# Patient Record
Sex: Female | Born: 1965 | Race: Black or African American | Hispanic: No | Marital: Married | Smoking: Current every day smoker
Health system: Southern US, Community
[De-identification: ages and names within clinical notes are randomized; demographics above are authoritative.]

## PROBLEM LIST (undated history)

## (undated) DIAGNOSIS — Z720 Tobacco use: Secondary | ICD-10-CM

---

## 2015-03-09 ENCOUNTER — Emergency Department (HOSPITAL_COMMUNITY): Payer: Federal, State, Local not specified - PPO

## 2015-03-09 ENCOUNTER — Encounter (HOSPITAL_COMMUNITY): Payer: Self-pay | Admitting: Emergency Medicine

## 2015-03-09 ENCOUNTER — Inpatient Hospital Stay (HOSPITAL_COMMUNITY)
Admission: EM | Admit: 2015-03-09 | Discharge: 2015-03-10 | DRG: 190 | Disposition: A | Discharge status: Home / Self Care | Payer: Federal, State, Local not specified - PPO | Attending: Internal Medicine | Admitting: Internal Medicine

## 2015-03-09 DIAGNOSIS — R0902 Hypoxemia: Secondary | ICD-10-CM

## 2015-03-09 DIAGNOSIS — R0602 Shortness of breath: Secondary | ICD-10-CM | POA: Diagnosis not present

## 2015-03-09 DIAGNOSIS — R079 Chest pain, unspecified: Secondary | ICD-10-CM

## 2015-03-09 DIAGNOSIS — J441 Chronic obstructive pulmonary disease with (acute) exacerbation: Principal | ICD-10-CM | POA: Insufficient documentation

## 2015-03-09 DIAGNOSIS — J440 Chronic obstructive pulmonary disease with acute lower respiratory infection: Secondary | ICD-10-CM | POA: Diagnosis present

## 2015-03-09 DIAGNOSIS — Z720 Tobacco use: Secondary | ICD-10-CM | POA: Diagnosis present

## 2015-03-09 DIAGNOSIS — R0600 Dyspnea, unspecified: Secondary | ICD-10-CM

## 2015-03-09 DIAGNOSIS — F1010 Alcohol abuse, uncomplicated: Secondary | ICD-10-CM | POA: Diagnosis present

## 2015-03-09 DIAGNOSIS — E876 Hypokalemia: Secondary | ICD-10-CM | POA: Diagnosis present

## 2015-03-09 DIAGNOSIS — F17210 Nicotine dependence, cigarettes, uncomplicated: Secondary | ICD-10-CM | POA: Diagnosis present

## 2015-03-09 DIAGNOSIS — J9601 Acute respiratory failure with hypoxia: Secondary | ICD-10-CM | POA: Diagnosis present

## 2015-03-09 DIAGNOSIS — J209 Acute bronchitis, unspecified: Secondary | ICD-10-CM | POA: Diagnosis present

## 2015-03-09 DIAGNOSIS — J9600 Acute respiratory failure, unspecified whether with hypoxia or hypercapnia: Secondary | ICD-10-CM | POA: Diagnosis present

## 2015-03-09 HISTORY — DX: Tobacco use: Z72.0

## 2015-03-09 LAB — CBC
HCT: 41.5 % (ref 36.0–46.0)
Hemoglobin: 14.5 g/dL (ref 12.0–15.0)
MCH: 30.9 pg (ref 26.0–34.0)
MCHC: 34.9 g/dL (ref 30.0–36.0)
MCV: 88.3 fL (ref 78.0–100.0)
Platelets: 177 10*3/uL (ref 150–400)
RBC: 4.7 MIL/uL (ref 3.87–5.11)
RDW: 12.7 % (ref 11.5–15.5)
WBC: 5.5 10*3/uL (ref 4.0–10.5)

## 2015-03-09 LAB — BASIC METABOLIC PANEL
Anion gap: 9 (ref 5–15)
BUN: <5 mg/dL — ABNORMAL LOW (ref 6–20)
CO2: 24 mmol/L (ref 22–32)
Calcium: 8.6 mg/dL — ABNORMAL LOW (ref 8.9–10.3)
Chloride: 102 mmol/L (ref 101–111)
Creatinine, Ser: 0.64 mg/dL (ref 0.44–1.00)
GFR calc Af Amer: >60 mL/min (ref >60)
GFR calc non Af Amer: >60 mL/min (ref >60)
GLUCOSE: 95 mg/dL (ref 65–99)
Potassium: 3.3 mmol/L — ABNORMAL LOW (ref 3.5–5.1)
Sodium: 135 mmol/L (ref 135–145)

## 2015-03-09 MED ORDER — IPRATROPIUM-ALBUTEROL 0.5-2.5 (3) MG/3ML IN SOLN
3.0000 mL | RESPIRATORY_TRACT | Status: AC
  Administered 2015-03-09 – 2015-03-10 (×3): 3 mL via RESPIRATORY_TRACT
  Filled 2015-03-09: qty 6
  Filled 2015-03-09: qty 3

## 2015-03-09 MED ORDER — PREDNISONE 20 MG PO TABS
60.0000 mg | ORAL_TABLET | Freq: Once | ORAL | Status: AC
  Administered 2015-03-09: 60 mg via ORAL
  Filled 2015-03-09: qty 3

## 2015-03-09 NOTE — ED Notes (Addendum)
Pt sattin at 92% RA, RR 32.  Pt placed on 2L O2.  Now RR 24, 96%O2

## 2015-03-09 NOTE — ED Provider Notes (Signed)
CSN: [id number]     Arrival date & time 03/09/15  2247 History  This chart was scribed for Toy Cookey, MD by Evon Slack, ED Scribe. This patient was seen in room A12C/A12C and the patient's care was started at 11:09 PM.      Chief Complaint  Patient presents with  . Shortness of Breath   Patient is a 49 y.o. female presenting with shortness of breath. The history is provided by the patient. No language interpreter was used.  Shortness of Breath Severity:  Moderate Onset quality:  Gradual Progression:  Worsening Chronicity:  New Context: smoke exposure   Relieved by:  Nothing Worsened by:  Coughing Associated symptoms: chest pain and cough   Associated symptoms: no abdominal pain, no diaphoresis, no fever, no headaches, no neck pain, no sore throat and no vomiting   Risk factors: no hx of PE/DVT    HPI Comments: Sabrina Castillo is a 49 y.o. female who presents to the Emergency Department complaining of SOB onset several days but has recently worsened today. Pt states that she has associated dry cough and CP. Pt states that she feels more short of breath when lying down. Pt states that the onset of her CP began yesterday after smoking a cigarette and coughing. Pt describes the CP as a pressure on her chest that's located in the center of her chest and intermittently radiates to her left chest. She states that the CP is worse when coughing. Pt states that she had slight relief after putting some vicks vapor rub on her chest and under her nose. Pt denies fever, chills or leg swelling. Pt does report driving from [place] in February. Pt states that she is a 1 pack per day smoker and has been since the age of 12.  Denies Hx of PE or DVT   History reviewed. No pertinent past medical history. History reviewed. No pertinent past surgical history. No family history on file. History  Substance Use Topics  . Smoking status: Current Every Day Smoker  . Smokeless tobacco: Not on file  .  Alcohol Use: No   OB History    No data available      Review of Systems  Constitutional: Negative for fever, chills, diaphoresis, activity change, appetite change and fatigue.  HENT: Negative for congestion, facial swelling, rhinorrhea and sore throat.   Eyes: Negative for photophobia and discharge.  Respiratory: Positive for cough and shortness of breath.   Cardiovascular: Positive for chest pain. Negative for palpitations and leg swelling.  Gastrointestinal: Negative for nausea, vomiting, abdominal pain and diarrhea.  Endocrine: Negative for polydipsia and polyuria.  Genitourinary: Negative for dysuria, frequency, difficulty urinating and pelvic pain.  Musculoskeletal: Negative for back pain, arthralgias, neck pain and neck stiffness.  Skin: Negative for color change and wound.  Allergic/Immunologic: Negative for immunocompromised state.  Neurological: Negative for facial asymmetry, weakness, numbness and headaches.  Hematological: Does not bruise/bleed easily.  Psychiatric/Behavioral: Negative for confusion and agitation.     Allergies  Review of patient's allergies indicates no known allergies.  Home Medications   Prior to Admission medications   Medication Sig Start Date End Date Taking? Authorizing Provider  acetaminophen (TYLENOL) 325 MG tablet Take 325 mg by mouth every 6 (six) hours as needed for headache.   Yes Historical Provider, MD   BP 133/79 mmHg  Pulse 94  Temp(Src) 98.1 F (36.7 C) (Oral)  Resp 20  Ht 5\' 1"  (1.549 m)  Wt 116 lb (52.617 kg)  BMI  21.93 kg/m2  SpO2 93%  LMP 02/09/2015   Physical Exam  Constitutional: She is oriented to person, place, and time. She appears well-developed and well-nourished. No distress.  HENT:  Head: Normocephalic.  Mouth/Throat: Oropharynx is clear and moist.  Eyes: Pupils are equal, round, and reactive to light.  Neck: Neck supple.  Cardiovascular: Normal rate, regular rhythm and normal heart sounds.    Pulmonary/Chest: Effort normal. No respiratory distress. She has decreased breath sounds. She has wheezes. She exhibits no tenderness.  inspiratory and expiratory wheezing throughout. Decreased air movement throughout.   Abdominal: Soft. She exhibits no distension. There is no tenderness. There is no rebound and no guarding.  Musculoskeletal: She exhibits no edema or tenderness.  Neurological: She is alert and oriented to person, place, and time.  Skin: Skin is warm and dry.  Psychiatric: She has a normal mood and affect.  Nursing note and vitals reviewed.   ED Course  Procedures (including critical care time) DIAGNOSTIC STUDIES: Oxygen Saturation is 90% on RA, Low by my interpretation.    COORDINATION OF CARE: 11:41 PM-Discussed treatment plan with pt at bedside and pt agreed to plan.     Labs Review Labs Reviewed  BASIC METABOLIC PANEL - Abnormal; Notable for the following:    Potassium 3.3 (*)    BUN <5 (*)    Calcium 8.6 (*)    All other components within normal limits  D-DIMER, QUANTITATIVE (NOT AT [facility]) - Abnormal; Notable for the following:    D-Dimer, Quant 1.51 (*)    All other components within normal limits  CBC  BRAIN NATRIURETIC PEPTIDE  I-STAT TROPOININ, ED    Imaging Review Dg Chest 2 View (if Patient Has Fever And/or Copd)  03/09/2015   CLINICAL DATA:  Shortness breath this morning after smoking cigarettes. Chest pain and productive cough.  EXAM: CHEST  2 VIEW  COMPARISON:  None.  FINDINGS: Cardiomediastinal silhouette is unremarkable. Moderate bronchitic changes, increased lung volumes with flattened hemidiaphragms. No pleural effusion or focal consolidation. No pneumothorax. Soft tissue planes and included osseous structure nonsuspicious.  IMPRESSION: Moderate bronchitic changes/COPD without focal consolidation.   Electronically Signed   By: Awilda Metro M.D.   On: 03/09/2015 23:25   Ct Angio Chest Pe W/cm &/or Wo Cm  03/10/2015   CLINICAL DATA:  Acute  onset of generalized chest pain and shortness of breath. Initial encounter.  EXAM: CT ANGIOGRAPHY CHEST WITH CONTRAST  TECHNIQUE: Multidetector CT imaging of the chest was performed using the standard protocol during bolus administration of intravenous contrast. Multiplanar CT image reconstructions and MIPs were obtained to evaluate the vascular anatomy.  CONTRAST:  OMNIPAQUE IOHEXOL 350 MG/ML SOLN  COMPARISON:  Chest radiograph performed 03/09/2015  FINDINGS: There is no evidence of pulmonary embolus.  Minimal right basilar atelectasis is noted. The lungs otherwise appear grossly clear bilaterally. There is slight peribronchial thickening, nonspecific in appearance. Scattered blebs are seen at the lung apices. There is no evidence of significant focal consolidation, pleural effusion or pneumothorax. No masses are identified; no abnormal focal contrast enhancement is seen.  The mediastinum is unremarkable in appearance. No mediastinal lymphadenopathy is seen. No pericardial effusion is identified. The great vessels are grossly unremarkable in appearance. No axillary lymphadenopathy is seen. The visualized portions of the thyroid gland are unremarkable in appearance.  Scattered hypodensities within the liver are nonspecific but may reflect small cysts, measuring up to 1.9 cm in size. The spleen is unremarkable in appearance. The visualized portions of the  pancreas, adrenal glands and kidneys are grossly unremarkable.  No acute osseous abnormalities are seen.  Review of the MIP images confirms the above findings.  IMPRESSION: 1. No evidence of pulmonary embolus. 2. Minimal right basilar atelectasis noted. Slight peribronchial thickening is nonspecific in appearance. Scattered blebs at the lung apices. Lungs otherwise grossly clear. 3. Scattered nonspecific hypodensities within the liver may reflect small cysts, measuring up to 1.9 cm in size.   Electronically Signed   By: Roanna RaiderJeffery  Chang M.D.   On: 03/10/2015  02:02     EKG Interpretation   Date/Time:  Sunday March 09 2015 22:54:31 EDT Ventricular Rate:  101 PR Interval:  118 QRS Duration: 68 QT Interval:  360 QTC Calculation: 466 R Axis:   82 Text Interpretation:  Sinus tachycardia Nonspecific T wave abnormality  Abnormal ECG No prior for comparison Confirmed by DOCHERTY  MD, MEGAN  ([id number]) on 03/09/2015 11:06:33 PM      MDM   Final diagnoses:  Dyspnea  Hypoxia  Chest pain    Pt is a 49 y.o. female with Pmhx as above who presents with 2-3 days of gradual onset SOB, cough, pleuritic chest pain. On PE, pt wheezing, hypoxic with wheezing throughout. Lungs improved with duoneb x3, PO pred, but remains hypoxic. D-dimer mildly elevated, CTA negative for PE, [facility] will admit for COPD exacerbation.     I personally performed the services described in this documentation, which was scribed in my presence. The recorded information has been reviewed and is accurate.      Toy CookeyMegan Docherty, MD 03/13/15 1224

## 2015-03-09 NOTE — ED Notes (Signed)
Pt. reports SOB with occasional productive cough onset today , denies fever or chills. No chest pain .

## 2015-03-10 ENCOUNTER — Emergency Department (HOSPITAL_COMMUNITY): Payer: Federal, State, Local not specified - PPO

## 2015-03-10 ENCOUNTER — Encounter (HOSPITAL_COMMUNITY): Payer: Self-pay | Admitting: *Deleted

## 2015-03-10 DIAGNOSIS — J440 Chronic obstructive pulmonary disease with acute lower respiratory infection: Secondary | ICD-10-CM | POA: Diagnosis present

## 2015-03-10 DIAGNOSIS — J441 Chronic obstructive pulmonary disease with (acute) exacerbation: Secondary | ICD-10-CM | POA: Diagnosis present

## 2015-03-10 DIAGNOSIS — F1010 Alcohol abuse, uncomplicated: Secondary | ICD-10-CM | POA: Diagnosis present

## 2015-03-10 DIAGNOSIS — J9601 Acute respiratory failure with hypoxia: Secondary | ICD-10-CM

## 2015-03-10 DIAGNOSIS — E876 Hypokalemia: Secondary | ICD-10-CM | POA: Diagnosis present

## 2015-03-10 DIAGNOSIS — R0902 Hypoxemia: Secondary | ICD-10-CM

## 2015-03-10 DIAGNOSIS — Z720 Tobacco use: Secondary | ICD-10-CM | POA: Diagnosis not present

## 2015-03-10 DIAGNOSIS — J9600 Acute respiratory failure, unspecified whether with hypoxia or hypercapnia: Secondary | ICD-10-CM | POA: Diagnosis present

## 2015-03-10 DIAGNOSIS — R0602 Shortness of breath: Secondary | ICD-10-CM | POA: Diagnosis present

## 2015-03-10 DIAGNOSIS — J209 Acute bronchitis, unspecified: Secondary | ICD-10-CM | POA: Diagnosis present

## 2015-03-10 DIAGNOSIS — F17210 Nicotine dependence, cigarettes, uncomplicated: Secondary | ICD-10-CM | POA: Diagnosis present

## 2015-03-10 LAB — BRAIN NATRIURETIC PEPTIDE: B Natriuretic Peptide: 31.4 pg/mL (ref 0.0–100.0)

## 2015-03-10 LAB — INFLUENZA PANEL BY PCR (TYPE A & B)
H1N1FLUPCR: NOT DETECTED
INFLAPCR: NEGATIVE
Influenza B By PCR: NEGATIVE

## 2015-03-10 LAB — BASIC METABOLIC PANEL
Anion gap: 12 (ref 5–15)
BUN: <5 mg/dL — ABNORMAL LOW (ref 6–20)
CHLORIDE: 104 mmol/L (ref 101–111)
CO2: 21 mmol/L — ABNORMAL LOW (ref 22–32)
Calcium: 8.7 mg/dL — ABNORMAL LOW (ref 8.9–10.3)
Creatinine, Ser: 0.81 mg/dL (ref 0.44–1.00)
GFR calc non Af Amer: >60 mL/min (ref >60)
GLUCOSE: 174 mg/dL — AB (ref 65–99)
POTASSIUM: 4 mmol/L (ref 3.5–5.1)
SODIUM: 137 mmol/L (ref 135–145)

## 2015-03-10 LAB — D-DIMER, QUANTITATIVE (NOT AT ARMC): D DIMER QUANT: 1.51 ug{FEU}/mL — AB (ref 0.00–0.48)

## 2015-03-10 LAB — CBC
HCT: 39.2 % (ref 36.0–46.0)
Hemoglobin: 13.3 g/dL (ref 12.0–15.0)
MCH: 30 pg (ref 26.0–34.0)
MCHC: 33.9 g/dL (ref 30.0–36.0)
MCV: 88.5 fL (ref 78.0–100.0)
Platelets: 152 10*3/uL (ref 150–400)
RBC: 4.43 MIL/uL (ref 3.87–5.11)
RDW: 12.9 % (ref 11.5–15.5)
WBC: 5.7 10*3/uL (ref 4.0–10.5)

## 2015-03-10 LAB — CREATININE, SERUM
Creatinine, Ser: 0.76 mg/dL (ref 0.44–1.00)
GFR calc Af Amer: >60 mL/min (ref >60)
GFR calc non Af Amer: >60 mL/min (ref >60)

## 2015-03-10 LAB — MAGNESIUM: MAGNESIUM: 1.7 mg/dL (ref 1.7–2.4)

## 2015-03-10 LAB — HIV ANTIBODY (ROUTINE TESTING W REFLEX): HIV Screen 4th Generation wRfx: NONREACTIVE

## 2015-03-10 LAB — I-STAT TROPONIN, ED: TROPONIN I, POC: 0 ng/mL (ref 0.00–0.08)

## 2015-03-10 LAB — PROTIME-INR
INR: 1.2 (ref 0.00–1.49)
Prothrombin Time: 15.4 seconds — ABNORMAL HIGH (ref 11.6–15.2)

## 2015-03-10 MED ORDER — ENOXAPARIN SODIUM 40 MG/0.4ML ~~LOC~~ SOLN: 40.0000 mg | SUBCUTANEOUS | Status: DC

## 2015-03-10 MED ORDER — PNEUMOCOCCAL VAC POLYVALENT 25 MCG/0.5ML IJ INJ: 0.5000 mL | INJECTION | INTRAMUSCULAR | Status: DC

## 2015-03-10 MED ORDER — AZITHROMYCIN 250 MG PO TABS: 250.0000 mg | ORAL_TABLET | Freq: Every day | ORAL | Status: DC

## 2015-03-10 MED ORDER — DM-GUAIFENESIN ER 30-600 MG PO TB12
1.0000 | ORAL_TABLET | Freq: Two times a day (BID) | ORAL | Status: DC
  Administered 2015-03-10: 1 via ORAL
  Filled 2015-03-10: qty 1

## 2015-03-10 MED ORDER — POTASSIUM CHLORIDE CRYS ER 20 MEQ PO TBCR
40.0000 meq | EXTENDED_RELEASE_TABLET | Freq: Once | ORAL | Status: AC
  Administered 2015-03-10: 40 meq via ORAL
  Filled 2015-03-10: qty 2

## 2015-03-10 MED ORDER — MONTELUKAST SODIUM 10 MG PO TABS: 10.0000 mg | ORAL_TABLET | Freq: Every day | ORAL | Status: AC

## 2015-03-10 MED ORDER — IOHEXOL 350 MG/ML SOLN
100.0000 mL | Freq: Once | INTRAVENOUS | Status: AC | PRN
  Administered 2015-03-10: 100 mL via INTRAVENOUS

## 2015-03-10 MED ORDER — METHYLPREDNISOLONE SODIUM SUCC 125 MG IJ SOLR
60.0000 mg | Freq: Three times a day (TID) | INTRAMUSCULAR | Status: DC
  Administered 2015-03-10: 60 mg via INTRAVENOUS
  Filled 2015-03-10: qty 2

## 2015-03-10 MED ORDER — NICOTINE 21 MG/24HR TD PT24: 21.0000 mg | MEDICATED_PATCH | Freq: Every day | TRANSDERMAL | Status: AC

## 2015-03-10 MED ORDER — ALBUTEROL SULFATE HFA 108 (90 BASE) MCG/ACT IN AERS: 2.0000 | INHALATION_SPRAY | Freq: Four times a day (QID) | RESPIRATORY_TRACT | Status: AC | PRN

## 2015-03-10 MED ORDER — NICOTINE 21 MG/24HR TD PT24
21.0000 mg | MEDICATED_PATCH | Freq: Every day | TRANSDERMAL | Status: DC
  Administered 2015-03-10: 21 mg via TRANSDERMAL
  Filled 2015-03-10: qty 1

## 2015-03-10 MED ORDER — ALBUTEROL SULFATE (2.5 MG/3ML) 0.083% IN NEBU: 3.0000 mL | INHALATION_SOLUTION | RESPIRATORY_TRACT | Status: DC | PRN

## 2015-03-10 MED ORDER — ENSURE ENLIVE PO LIQD
237.0000 mL | Freq: Two times a day (BID) | ORAL | Status: DC
  Administered 2015-03-10: 237 mL via ORAL

## 2015-03-10 MED ORDER — IPRATROPIUM-ALBUTEROL 0.5-2.5 (3) MG/3ML IN SOLN
3.0000 mL | RESPIRATORY_TRACT | Status: DC
  Administered 2015-03-10 (×2): 3 mL via RESPIRATORY_TRACT
  Filled 2015-03-10: qty 3

## 2015-03-10 MED ORDER — AZITHROMYCIN 500 MG PO TABS
500.0000 mg | ORAL_TABLET | Freq: Every day | ORAL | Status: AC
  Administered 2015-03-10: 500 mg via ORAL
  Filled 2015-03-10: qty 1

## 2015-03-10 MED ORDER — AZITHROMYCIN 250 MG PO TABS: 250.0000 mg | ORAL_TABLET | Freq: Every day | ORAL | Status: AC

## 2015-03-10 MED ORDER — SODIUM CHLORIDE 0.9 % IV SOLN
INTRAVENOUS | Status: DC
  Administered 2015-03-10: 06:00:00 via INTRAVENOUS

## 2015-03-10 MED ORDER — ACETAMINOPHEN 325 MG PO TABS: 325.0000 mg | ORAL_TABLET | Freq: Four times a day (QID) | ORAL | Status: DC | PRN

## 2015-03-10 MED ORDER — PREDNISONE 5 MG PO TABS: ORAL_TABLET | ORAL | Status: AC

## 2015-03-10 NOTE — H&P (Signed)
[facility] History and Physical  Sabrina Castillo ZOX:[id number] DOB: 09/16/1965 DOA: 03/09/2015  Referring physician: ED physician PCP: No PCP Per Patient  Specialists:   Chief Complaint: cough and SOB  HPI: Sabrina Ironlsie Dowse is a 49 y.o. female with PMH of tobacco abuse, who presents with cough and SOB.  Patient reports that in the past several days, she has been having cough, shortness of breath, and mild chest pain. She coughs up yellow colored sputum. She had chest pain yesterday, which has resolved currently.   Currently patient denies fever, chills, running nose, ear pain, headaches, abdominal pain, diarrhea, constipation, dysuria, urgency, frequency, hematuria, skin rashes, joint pain or leg swelling. No unilateral weakness, numbness or tingling sensations. No vision change or hearing loss.  In ED, patient was found to have positive d-dimer, but negative CTA for PE. Chest x-ray is consistent with bronchitis. WBC 5.5, tachycardia, temperature normal, troponin negative, BNP 31.4, potassium is 3.3. Patient is admitted to inpatient for further evaluation and treatment.  Where does patient live?   At home    Can patient participate in ADLs?  Yes    Review of Systems:   General: no fevers, chills, no changes in body weight, has fatigue HEENT: no blurry vision, hearing changes or sore throat Pulm: has dyspnea, coughing, wheezing CV: had chest pain which resoved, palpitations Abd: no nausea, vomiting, abdominal pain, diarrhea, constipation GU: no dysuria, burning on urination, increased urinary frequency, hematuria  Ext: no leg edema Neuro: no unilateral weakness, numbness, or tingling, no vision change or hearing loss Skin: no rash MSK: No muscle spasm, no deformity, no limitation of range of movement in spin Heme: No easy bruising.  Travel history: No recent long distant travel.  Allergy: No Known Allergies  Past Medical History  Diagnosis Date  . Tobacco abuse      History reviewed. No pertinent past surgical history.  Social History:  reports that she has been smoking Cigarettes.  She has been smoking about 1.00 pack per day. She does not have any smokeless tobacco history on file. She reports that she uses illicit drugs (Marijuana). She reports that she does not drink alcohol.  Family History:  Family History  Problem Relation Age of Onset  . Hypertension Mother   . Lung cancer Father   . COPD Brother   . Diabetes Sister      Prior to Admission medications   Medication Sig Start Date End Date Taking? Authorizing Provider  acetaminophen (TYLENOL) 325 MG tablet Take 325 mg by mouth every 6 (six) hours as needed for headache.   Yes Historical Provider, MD    Physical Exam: Filed Vitals:   03/10/15 0345 03/10/15 0415 03/10/15 0430 03/10/15 0500  BP: 118/78 124/77 115/80 109/65  Pulse: 100 86 104 82  Temp:    98 F (36.7 C)  TempSrc:    Oral  Resp: 24 16 22 18   Height:    5\' 1"  (1.549 m)  Weight:    48.671 kg (107 lb 4.8 oz)  SpO2: 93% 93% 93% 100%   General: Not in acute distress HEENT:       Eyes: PERRL, EOMI, no scleral icterus.       ENT: No discharge from the ears and nose, no pharynx injection, no tonsillar enlargement.        Neck: No JVD, no bruit, no mass felt. Heme: No neck lymph node enlargement. Cardiac: S1/S2, RRR, No murmurs, No gallops or rubs. Pulm: decreased air movement bilaterally. Has wheezing.  No rales or rubs. Abd: Soft, nondistended, nontender, no rebound pain, no organomegaly, BS present. Ext: No pitting leg edema bilaterally. 2+DP/PT pulse bilaterally. Musculoskeletal: No joint deformities, No joint redness or warmth, no limitation of ROM in spin. Skin: No rashes.  Neuro: Alert, oriented X3, cranial nerves II-XII grossly intact, muscle strength 5/5 in all extremities, sensation to light touch intact. Psych: Patient is not psychotic, no suicidal or hemocidal ideation.  Labs on Admission:  Basic Metabolic  Panel:  Recent Labs Lab 03/09/15 2301 03/10/15 0526  NA 135  --   K 3.3*  --   CL 102  --   CO2 24  --   GLUCOSE 95  --   BUN <5*  --   CREATININE 0.64 0.76  CALCIUM 8.6*  --   MG  --  1.7   Liver Function Tests: No results for input(s): AST, ALT, ALKPHOS, BILITOT, PROT, ALBUMIN in the last 168 hours. No results for input(s): LIPASE, AMYLASE in the last 168 hours. No results for input(s): AMMONIA in the last 168 hours. CBC:  Recent Labs Lab 03/09/15 2301 03/10/15 0526  WBC 5.5 5.7  HGB 14.5 13.3  HCT 41.5 39.2  MCV 88.3 88.5  PLT 177 152   Cardiac Enzymes: No results for input(s): CKTOTAL, CKMB, CKMBINDEX, TROPONINI in the last 168 hours.  BNP (last 3 results)  Recent Labs  03/09/15 2301  BNP 31.4    ProBNP (last 3 results) No results for input(s): PROBNP in the last 8760 hours.  CBG: No results for input(s): GLUCAP in the last 168 hours.  Radiological Exams on Admission: Dg Chest 2 View (if Patient Has Fever And/or Copd)  03/09/2015   CLINICAL DATA:  Shortness breath this morning after smoking cigarettes. Chest pain and productive cough.  EXAM: CHEST  2 VIEW  COMPARISON:  None.  FINDINGS: Cardiomediastinal silhouette is unremarkable. Moderate bronchitic changes, increased lung volumes with flattened hemidiaphragms. No pleural effusion or focal consolidation. No pneumothorax. Soft tissue planes and included osseous structure nonsuspicious.  IMPRESSION: Moderate bronchitic changes/COPD without focal consolidation.   Electronically Signed   By: Awilda Metro M.D.   On: 03/09/2015 23:25   Ct Angio Chest Pe W/cm &/or Wo Cm  03/10/2015   CLINICAL DATA:  Acute onset of generalized chest pain and shortness of breath. Initial encounter.  EXAM: CT ANGIOGRAPHY CHEST WITH CONTRAST  TECHNIQUE: Multidetector CT imaging of the chest was performed using the standard protocol during bolus administration of intravenous contrast. Multiplanar CT image reconstructions and MIPs  were obtained to evaluate the vascular anatomy.  CONTRAST:  OMNIPAQUE IOHEXOL 350 MG/ML SOLN  COMPARISON:  Chest radiograph performed 03/09/2015  FINDINGS: There is no evidence of pulmonary embolus.  Minimal right basilar atelectasis is noted. The lungs otherwise appear grossly clear bilaterally. There is slight peribronchial thickening, nonspecific in appearance. Scattered blebs are seen at the lung apices. There is no evidence of significant focal consolidation, pleural effusion or pneumothorax. No masses are identified; no abnormal focal contrast enhancement is seen.  The mediastinum is unremarkable in appearance. No mediastinal lymphadenopathy is seen. No pericardial effusion is identified. The great vessels are grossly unremarkable in appearance. No axillary lymphadenopathy is seen. The visualized portions of the thyroid gland are unremarkable in appearance.  Scattered hypodensities within the liver are nonspecific but may reflect small cysts, measuring up to 1.9 cm in size. The spleen is unremarkable in appearance. The visualized portions of the pancreas, adrenal glands and kidneys are grossly unremarkable.  No acute osseous abnormalities are seen.  Review of the MIP images confirms the above findings.  IMPRESSION: 1. No evidence of pulmonary embolus. 2. Minimal right basilar atelectasis noted. Slight peribronchial thickening is nonspecific in appearance. Scattered blebs at the lung apices. Lungs otherwise grossly clear. 3. Scattered nonspecific hypodensities within the liver may reflect small cysts, measuring up to 1.9 cm in size.   Electronically Signed   By: Roanna Raider M.D.   On: 03/10/2015 02:02    EKG: Independently reviewed. No ischemia    Assessment/Plan Principal Problem:   Acute respiratory failure Active Problems:   SOB (shortness of breath)   Tobacco abuse   Hypokalemia   Acute respiratory failure: No pneumonia on chest x-ray, no PE on CTA. CXR is consistent with bronchitis.  It is likely that patient has undiagnosed of COPD given her heavy smoking. Will treat as COPD exacerbation and bronchitis.  -will admit patient to telemetry bed  -Nebulizers: scheduled Duoneb and prn albuterol -Solu-Medrol 60 mg IV tid -Oral azithromycin for 5 days.  -Mucinex for cough  -Urine drug screen, HIV -Urine legionella and S. pneumococcal antigen -Follow up blood culture x2, sputum culture, Flu pcr  Tobacco abuse and Alcohol abuse: -Did counseling about importance of quitting smoking -Nicotine patch  Hypokalemia: K=  3.3 on admission. - Repleted - Check Mg level  DVT ppx: SQ Lovenox  Code Status: Full code Family Communication:  Yes, patient's  husband  at bed side Disposition Plan: Admit to inpatient   Date of Service 03/10/2015    Lorretta Harp [facility] Pager [phone]  If 7PM-7AM, please contact night-coverage [email or web address] Password [email or web address] 03/10/2015, 6:42 AM

## 2015-03-10 NOTE — Progress Notes (Signed)
SATURATION QUALIFICATIONS: (This note is used to comply with regulatory documentation for home oxygen)  Patient Saturations on Room Air at Rest =  99%  Patient Saturations on Room Air while Ambulating =  97%  Patient Saturations on  N/A Liters of oxygen while Ambulating = N/A  Please briefly explain why patient needs home oxygen: N/A

## 2015-03-10 NOTE — Discharge Summary (Addendum)
Sabrina Castillo, is a 49 y.o. female  DOB July 07, 1966  MRN [id number].  Admission date:  03/09/2015  Admitting Physician  Lorretta Harp, MD  Discharge Date:  03/10/2015   Primary MD  No PCP Per Patient  Recommendations for primary care physician for things to follow:   Follow CT results for liver cysts, outpatient one-time GI follow-up recommended.  One-time outpatient pulmonary follow-up for possible undiagnosed COPD/asthma.  Follow on pending HIV, legionella and strep pneumonia antigen results.   Admission Diagnosis  Dyspnea [R06.00] Hypoxia [R09.02] COPD exacerbation [J44.1] Chest pain [R07.9]   Discharge Diagnosis  Dyspnea [R06.00] Hypoxia [R09.02] COPD exacerbation [J44.1] Chest pain [R07.9]    Principal Problem:   Acute respiratory failure Active Problems:   SOB (shortness of breath)   Tobacco abuse   Hypokalemia   COPD exacerbation      Past Medical History  Diagnosis Date  . Tobacco abuse     History reviewed. No pertinent past surgical history.     HPI  from the history and physical done on the day of admission:     Sabrina Castillo is a 49 y.o. female with PMH of tobacco abuse, who presents with cough and SOB.  Patient reports that in the past several days, she has been having cough, shortness of breath, and mild chest pain. She coughs up yellow colored sputum. She had chest pain yesterday, which has resolved currently.   Currently patient denies fever, chills, running nose, ear pain, headaches, abdominal pain, diarrhea, constipation, dysuria, urgency, frequency, hematuria, skin rashes, joint pain or leg swelling. No unilateral weakness, numbness or tingling sensations. No vision change or hearing loss.  In ED, patient was found to have positive d-dimer, but negative CTA for PE. Chest x-ray  is consistent with bronchitis. WBC 5.5, tachycardia, temperature normal, troponin negative, BNP 31.4, potassium is 3.3. Patient is admitted to inpatient for further evaluation and treatment.    Hospital Course:   1. Acute hypoxic respiratory failure. Secondary to acute bronchitis with possible undiagnosed COPD/asthma. Initially was wheezing and required oxygen, this morning after supportive care with steroids and empiric anti-biotics is no wheezing, patient is symptom-free, amplitude in the hallway with pulse ox above 95% on room air. Will be discharged on oral steroids taper and oral and diabetic for 5 more days. Request PCP to check CBC, CMP and a 2 view chest x-ray next visit.  Follow on pending HIV, legionella and strep pneumonia antigen results.    2. History of smoking and alcohol. Counseled to quit.   3. Hypokalemia. Replaced and stable   4. Incidental finding of liver cysts on CT chest. Follow outpatient by PCP, consider one-time outpatient GI follow-up.     Discharge Condition: Stable  Follow UP  Follow-up Information    Follow up with PCP in [place]. Schedule an appointment as soon as possible for a visit in 1 week.      Follow up with [facility]    . Schedule an appointment as soon as  possible for a visit in 1 week.   Contact information:   [address] [place] [address] [phone]       Consults obtained - None  Diet and Activity recommendation: See Discharge Instructions below  Discharge Instructions           Discharge Instructions    Diet - low sodium heart healthy    Complete by:  As directed      Discharge instructions    Complete by:  As directed   Follow with Primary MD in 7 days, follow your CT results with PCP   Get CBC, CMP, 2 view Chest X ray checked  by Primary MD next visit.    Activity: As tolerated with Full fall precautions use walker/cane & assistance as  needed   Disposition Home     Diet: Heart Healthy    For Heart failure patients - Check your Weight same time everyday, if you gain over 2 pounds, or you develop in leg swelling, experience more shortness of breath or chest pain, call your Primary MD immediately. Follow Cardiac Low Salt Diet and 1.5 lit/day fluid restriction.   On your next visit with your primary care physician please Get Medicines reviewed and adjusted.   Please request your Prim.MD to go over all Hospital Tests and Procedure/Radiological results at the follow up, please get all Hospital records sent to your Prim MD by signing hospital release before you go home.   If you experience worsening of your admission symptoms, develop shortness of breath, life threatening emergency, suicidal or homicidal thoughts you must seek medical attention immediately by calling 911 or calling your MD immediately  if symptoms less severe.  You Must read complete instructions/literature along with all the possible adverse reactions/side effects for all the Medicines you take and that have been prescribed to you. Take any new Medicines after you have completely understood and accpet all the possible adverse reactions/side effects.   Do not drive, operating heavy machinery, perform activities at heights, swimming or participation in water activities or provide baby sitting services if your were admitted for syncope or siezures until you have seen by Primary MD or a Neurologist and advised to do so again.  Do not drive when taking Pain medications.    Do not take more than prescribed Pain, Sleep and Anxiety Medications  Special Instructions: If you have smoked or chewed Tobacco  in the last 2 yrs please stop smoking, stop any regular Alcohol  and or any Recreational drug use.  Wear Seat belts while driving.   Please note  You were cared for by a hospitalist during your hospital stay. If you have any questions about your discharge  medications or the care you received while you were in the hospital after you are discharged, you can call the unit and asked to speak with the hospitalist on call if the hospitalist that took care of you is not available. Once you are discharged, your primary care physician will handle any further medical issues. Please note that NO REFILLS for any discharge medications will be authorized once you are discharged, as it is imperative that you return to your primary care physician (or establish a relationship with a primary care physician if you do not have one) for your aftercare needs so that they can reassess your need for medications and monitor your lab values.     Increase activity slowly    Complete by:  As directed  Discharge Medications       Medication List    TAKE these medications        acetaminophen 325 MG tablet  Commonly known as:  TYLENOL  Take 325 mg by mouth every 6 (six) hours as needed for headache.     albuterol 108 (90 BASE) MCG/ACT inhaler  Commonly known as:  PROVENTIL HFA;VENTOLIN HFA  Inhale 2 puffs into the lungs every 6 (six) hours as needed for wheezing or shortness of breath.     azithromycin 250 MG tablet  Commonly known as:  ZITHROMAX  Take 1 tablet (250 mg total) by mouth daily.     montelukast 10 MG tablet  Commonly known as:  SINGULAIR  Take 1 tablet (10 mg total) by mouth at bedtime.     nicotine 21 mg/24hr patch  Commonly known as:  NICODERM CQ - dosed in mg/24 hours  Place 1 patch (21 mg total) onto the skin daily.     predniSONE 5 MG tablet  Commonly known as:  DELTASONE  Label  & dispense according to the schedule below. 10 Pills PO for 3 days then, 8 Pills PO for 3 days, 6 Pills PO for 3 days, 4 Pills PO for 3 days, 2 Pills PO for 3 days, 1 Pills PO for 3 days, 1/2 Pill  PO for 3 days then STOP. Total 95 pills.        Major procedures and Radiology Reports - PLEASE review detailed and final reports for all details, in  brief -       Dg Chest 2 View (if Patient Has Fever And/or Copd)  03/09/2015   CLINICAL DATA:  Shortness breath this morning after smoking cigarettes. Chest pain and productive cough.  EXAM: CHEST  2 VIEW  COMPARISON:  None.  FINDINGS: Cardiomediastinal silhouette is unremarkable. Moderate bronchitic changes, increased lung volumes with flattened hemidiaphragms. No pleural effusion or focal consolidation. No pneumothorax. Soft tissue planes and included osseous structure nonsuspicious.  IMPRESSION: Moderate bronchitic changes/COPD without focal consolidation.   Electronically Signed   By: Awilda Metro M.D.   On: 03/09/2015 23:25   Ct Angio Chest Pe W/cm &/or Wo Cm  03/10/2015   CLINICAL DATA:  Acute onset of generalized chest pain and shortness of breath. Initial encounter.  EXAM: CT ANGIOGRAPHY CHEST WITH CONTRAST  TECHNIQUE: Multidetector CT imaging of the chest was performed using the standard protocol during bolus administration of intravenous contrast. Multiplanar CT image reconstructions and MIPs were obtained to evaluate the vascular anatomy.  CONTRAST:  OMNIPAQUE IOHEXOL 350 MG/ML SOLN  COMPARISON:  Chest radiograph performed 03/09/2015  FINDINGS: There is no evidence of pulmonary embolus.  Minimal right basilar atelectasis is noted. The lungs otherwise appear grossly clear bilaterally. There is slight peribronchial thickening, nonspecific in appearance. Scattered blebs are seen at the lung apices. There is no evidence of significant focal consolidation, pleural effusion or pneumothorax. No masses are identified; no abnormal focal contrast enhancement is seen.  The mediastinum is unremarkable in appearance. No mediastinal lymphadenopathy is seen. No pericardial effusion is identified. The great vessels are grossly unremarkable in appearance. No axillary lymphadenopathy is seen. The visualized portions of the thyroid gland are unremarkable in appearance.  Scattered hypodensities within the  liver are nonspecific but may reflect small cysts, measuring up to 1.9 cm in size. The spleen is unremarkable in appearance. The visualized portions of the pancreas, adrenal glands and kidneys are grossly unremarkable.  No acute osseous abnormalities are seen.  Review  of the MIP images confirms the above findings.  IMPRESSION: 1. No evidence of pulmonary embolus. 2. Minimal right basilar atelectasis noted. Slight peribronchial thickening is nonspecific in appearance. Scattered blebs at the lung apices. Lungs otherwise grossly clear. 3. Scattered nonspecific hypodensities within the liver may reflect small cysts, measuring up to 1.9 cm in size.   Electronically Signed   By: Roanna RaiderJeffery  Chang M.D.   On: 03/10/2015 02:02    Micro Results      No results found for this or any previous visit (from the past 240 hour(s)).     Today   Subjective    Luiz Ironlsie Chohan today has no headache,no chest abdominal pain,no new weakness tingling or numbness, feels much better wants to go home today.     Objective   Blood pressure 102/66, pulse 83, temperature 97.9 F (36.6 C), temperature source Oral, resp. rate 19, height 5\' 1"  (1.549 m), weight 48.671 kg (107 lb 4.8 oz), last menstrual period 02/09/2015, SpO2 98 %.  No intake or output data in the 24 hours ending 03/10/15 1205  Exam Awake Alert, Oriented x 3, No new F.N deficits, Normal affect [place].AT,PERRAL Supple Neck,No JVD, No cervical lymphadenopathy appriciated.  Symmetrical Chest wall movement, Good air movement bilaterally, CTAB RRR,No Gallops,Rubs or new Murmurs, No Parasternal Heave +ve B.Sounds, Abd Soft, Non tender, No organomegaly appriciated, No rebound -guarding or rigidity. No Cyanosis, Clubbing or edema, No new Rash or bruise   Data Review   CBC w Diff:  Lab Results  Component Value Date   WBC 5.7 03/10/2015   HGB 13.3 03/10/2015   HCT 39.2 03/10/2015   PLT 152 03/10/2015    CMP:  Lab Results  Component Value Date   NA 137  03/10/2015   K 4.0 03/10/2015   CL 104 03/10/2015   CO2 21* 03/10/2015   BUN <5* 03/10/2015   CREATININE 0.81 03/10/2015  .   Total Time in preparing paper work, data evaluation and todays exam - 35 minutes  Leroy SeaSINGH,PRASHANT K M.D on 03/10/2015 at 12:05 PM  [facility]   Office  [phone]

## 2015-03-10 NOTE — Discharge Instructions (Signed)
Follow with Primary MD in 7 days, follow your CT results with PCP   Get CBC, CMP, 2 view Chest X ray checked  by Primary MD next visit.    Activity: As tolerated with Full fall precautions use walker/cane & assistance as needed   Disposition Home     Diet: Heart Healthy    For Heart failure patients - Check your Weight same time everyday, if you gain over 2 pounds, or you develop in leg swelling, experience more shortness of breath or chest pain, call your Primary MD immediately. Follow Cardiac Low Salt Diet and 1.5 lit/day fluid restriction.   On your next visit with your primary care physician please Get Medicines reviewed and adjusted.   Please request your Prim.MD to go over all Hospital Tests and Procedure/Radiological results at the follow up, please get all Hospital records sent to your Prim MD by signing hospital release before you go home.   If you experience worsening of your admission symptoms, develop shortness of breath, life threatening emergency, suicidal or homicidal thoughts you must seek medical attention immediately by calling 911 or calling your MD immediately  if symptoms less severe.  You Must read complete instructions/literature along with all the possible adverse reactions/side effects for all the Medicines you take and that have been prescribed to you. Take any new Medicines after you have completely understood and accpet all the possible adverse reactions/side effects.   Do not drive, operating heavy machinery, perform activities at heights, swimming or participation in water activities or provide baby sitting services if your were admitted for syncope or siezures until you have seen by Primary MD or a Neurologist and advised to do so again.  Do not drive when taking Pain medications.    Do not take more than prescribed Pain, Sleep and Anxiety Medications  Special Instructions: If you have smoked or chewed Tobacco  in the last 2 yrs please stop smoking,  stop any regular Alcohol  and or any Recreational drug use.  Wear Seat belts while driving.   Please note  You were cared for by a hospitalist during your hospital stay. If you have any questions about your discharge medications or the care you received while you were in the hospital after you are discharged, you can call the unit and asked to speak with the hospitalist on call if the hospitalist that took care of you is not available. Once you are discharged, your primary care physician will handle any further medical issues. Please note that NO REFILLS for any discharge medications will be authorized once you are discharged, as it is imperative that you return to your primary care physician (or establish a relationship with a primary care physician if you do not have one) for your aftercare needs so that they can reassess your need for medications and monitor your lab values.

## 2015-03-10 NOTE — Progress Notes (Signed)
Discharged instructions and papers printed explained and given to the patient and her spouse.Patient's questions answered with their satisfaction.

## 2015-03-10 NOTE — ED Notes (Signed)
Pt satting at 89%.  Pt put back on 2L O2.

## 2015-03-10 NOTE — ED Notes (Signed)
Pt dropped to 87% O2 while ambulating.  Dr. Micheline Mazeocherty made aware.

## 2015-03-15 LAB — CULTURE, BLOOD (ROUTINE X 2)
CULTURE: NO GROWTH
CULTURE: NO GROWTH

## 2015-09-27 ENCOUNTER — Emergency Department (HOSPITAL_COMMUNITY)
Admission: EM | Admit: 2015-09-27 | Discharge: 2015-09-27 | Disposition: A | Discharge status: Home / Self Care | Payer: Federal, State, Local not specified - PPO | Attending: Emergency Medicine | Admitting: Emergency Medicine

## 2015-09-27 ENCOUNTER — Emergency Department (HOSPITAL_COMMUNITY): Payer: Federal, State, Local not specified - PPO

## 2015-09-27 ENCOUNTER — Encounter (HOSPITAL_COMMUNITY): Payer: Self-pay | Admitting: Emergency Medicine

## 2015-09-27 DIAGNOSIS — J441 Chronic obstructive pulmonary disease with (acute) exacerbation: Secondary | ICD-10-CM | POA: Diagnosis not present

## 2015-09-27 DIAGNOSIS — Z792 Long term (current) use of antibiotics: Secondary | ICD-10-CM | POA: Diagnosis not present

## 2015-09-27 DIAGNOSIS — F17210 Nicotine dependence, cigarettes, uncomplicated: Secondary | ICD-10-CM | POA: Insufficient documentation

## 2015-09-27 DIAGNOSIS — Z79899 Other long term (current) drug therapy: Secondary | ICD-10-CM | POA: Diagnosis not present

## 2015-09-27 DIAGNOSIS — R0602 Shortness of breath: Secondary | ICD-10-CM | POA: Diagnosis present

## 2015-09-27 MED ORDER — IPRATROPIUM-ALBUTEROL 0.5-2.5 (3) MG/3ML IN SOLN
3.0000 mL | Freq: Once | RESPIRATORY_TRACT | Status: AC
  Administered 2015-09-27: 3 mL via RESPIRATORY_TRACT
  Filled 2015-09-27: qty 3

## 2015-09-27 MED ORDER — ALBUTEROL SULFATE (5 MG/ML) 0.5% IN NEBU: 2.5000 mg | INHALATION_SOLUTION | Freq: Four times a day (QID) | RESPIRATORY_TRACT | Status: AC | PRN

## 2015-09-27 MED ORDER — ALBUTEROL SULFATE HFA 108 (90 BASE) MCG/ACT IN AERS
5.0000 | INHALATION_SPRAY | Freq: Once | RESPIRATORY_TRACT | Status: AC
  Administered 2015-09-27: 5 via RESPIRATORY_TRACT
  Filled 2015-09-27: qty 6.7

## 2015-09-27 MED ORDER — TIOTROPIUM BROMIDE MONOHYDRATE 18 MCG IN CAPS: 18.0000 ug | ORAL_CAPSULE | Freq: Every day | RESPIRATORY_TRACT | Status: AC

## 2015-09-27 MED ORDER — IPRATROPIUM-ALBUTEROL 0.5-2.5 (3) MG/3ML IN SOLN
3.0000 mL | Freq: Once | RESPIRATORY_TRACT | Status: AC
  Administered 2015-09-27: 3 mL via RESPIRATORY_TRACT

## 2015-09-27 MED ORDER — DEXAMETHASONE 4 MG PO TABS
10.0000 mg | ORAL_TABLET | Freq: Once | ORAL | Status: AC
  Administered 2015-09-27: 10 mg via ORAL
  Filled 2015-09-27: qty 3

## 2015-09-27 MED ORDER — AEROCHAMBER PLUS FLO-VU SMALL MISC: 1.0000 | Freq: Once | Status: DC

## 2015-09-27 MED ORDER — AEROCHAMBER PLUS W/MASK MISC
Status: AC
  Filled 2015-09-27: qty 1

## 2015-09-27 MED ORDER — ALBUTEROL SULFATE HFA 108 (90 BASE) MCG/ACT IN AERS: 1.0000 | INHALATION_SPRAY | Freq: Four times a day (QID) | RESPIRATORY_TRACT | Status: AC | PRN

## 2015-09-27 MED ORDER — IPRATROPIUM-ALBUTEROL 0.5-2.5 (3) MG/3ML IN SOLN
RESPIRATORY_TRACT | Status: AC
  Filled 2015-09-27: qty 3

## 2015-09-27 NOTE — Discharge Instructions (Signed)
Return without fail for worsening symptoms including difficulty breathing despite your inhaler, severe chest pain, passing out, or any other symptoms concerning to. Give yourself your inhaler every 4 hours scheduled for the first 24 hours, then use it as needed.  Chronic Obstructive Pulmonary Disease Exacerbation Chronic obstructive pulmonary disease (COPD) is a common lung problem. In COPD, the flow of air from the lungs is limited. COPD exacerbations are times that breathing gets worse and you need extra treatment. Without treatment they can be life threatening. If they happen often, your lungs can become more damaged. If your COPD gets worse, your doctor may treat you with:  Medicines.  Oxygen.  Different ways to clear your airway, such as using a mask. HOME CARE  Do not smoke.  Avoid tobacco smoke and other things that bother your lungs.  If given, take your antibiotic medicine as told. Finish the medicine even if you start to feel better.  Only take medicines as told by your doctor.  Drink enough fluids to keep your pee (urine) clear or pale yellow (unless your doctor has told you not to).  Use a cool mist machine (vaporizer).  If you use oxygen or a machine that turns liquid medicine into a mist (nebulizer), continue to use them as told.  Keep up with shots (vaccinations) as told by your doctor.  Exercise regularly.  Eat healthy foods.  Keep all doctor visits as told. GET HELP RIGHT AWAY IF:  You are very short of breath and it gets worse.  You have trouble talking.  You have bad chest pain.  You have blood in your spit (sputum).  You have a fever.  You keep throwing up (vomiting).  You feel weak, or you pass out (faint).  You feel confused.  You keep getting worse. MAKE SURE YOU:  Understand these instructions.  Will watch your condition.  Will get help right away if you are not doing well or get worse.   This information is not intended to replace  advice given to you by your health care provider. Make sure you discuss any questions you have with your health care provider.   Document Released: 08/12/2011 Document Revised: 09/13/2014 Document Reviewed: 04/27/2013 Elsevier Interactive Patient Education [employer]

## 2015-09-27 NOTE — ED Notes (Signed)
Pt sts hx of COPD and sts increased SOB since running out of inhaler last night; pt sts productive cough

## 2015-09-27 NOTE — ED Provider Notes (Signed)
CSN: [id number]     Arrival date & time 09/27/15  1432 History   First MD Initiated Contact with Patient 09/27/15 1747     Chief Complaint  Patient presents with  . Shortness of Breath     (Consider location/radiation/quality/duration/timing/severity/associated sxs/prior Treatment) Patient is a 50 y.o. female presenting with shortness of breath. The history is provided by the patient.  Shortness of Breath Severity:  Moderate Onset quality:  Gradual Duration:  2 days Timing:  Constant Progression:  Worsening Chronicity:  Recurrent Context: not activity and not URI   Relieved by:  Inhaler Worsened by:  Nothing tried Ineffective treatments:  None tried Associated symptoms: cough and wheezing   Associated symptoms: no abdominal pain, no chest pain, no fever, no headaches, no rash, no sputum production and no vomiting     Past Medical History  Diagnosis Date  . Tobacco abuse    History reviewed. No pertinent past surgical history. Family History  Problem Relation Age of Onset  . Hypertension Mother   . Lung cancer Father   . COPD Brother   . Diabetes Sister    Social History  Substance Use Topics  . Smoking status: Current Every Day Smoker -- 1.00 packs/day    Types: Cigarettes  . Smokeless tobacco: None  . Alcohol Use: No   OB History    No data available     Review of Systems  Constitutional: Negative for fever and activity change.  HENT: Negative.   Eyes: Negative for visual disturbance.  Respiratory: Positive for cough, shortness of breath and wheezing. Negative for sputum production.   Cardiovascular: Negative for chest pain.  Gastrointestinal: Negative for nausea, vomiting, abdominal pain and diarrhea.  Genitourinary: Negative.   Musculoskeletal: Negative.   Skin: Negative for pallor, rash and wound.  Neurological: Negative for headaches.      Allergies  Montelukast sodium  Home Medications   Prior to Admission medications   Medication Sig Start  Date End Date Taking? Authorizing Provider  acetaminophen (TYLENOL) 325 MG tablet Take 325 mg by mouth every 6 (six) hours as needed for headache.   Yes Historical Provider, MD  albuterol (PROVENTIL HFA;VENTOLIN HFA) 108 (90 BASE) MCG/ACT inhaler Inhale 2 puffs into the lungs every 6 (six) hours as needed for wheezing or shortness of breath. 03/10/15  Yes Leroy Sea, MD  guaiFENesin (MUCINEX) 600 MG 12 hr tablet Take 600 mg by mouth 2 (two) times daily as needed for cough or to loosen phlegm.   Yes Historical Provider, MD  albuterol (PROVENTIL HFA;VENTOLIN HFA) 108 (90 Base) MCG/ACT inhaler Inhale 1-2 puffs into the lungs every 6 (six) hours as needed for wheezing or shortness of breath (with spacer). 09/27/15   Marijean Niemann, MD  albuterol (PROVENTIL) (5 MG/ML) 0.5% nebulizer solution Take 0.5 mLs (2.5 mg total) by nebulization every 6 (six) hours as needed for wheezing or shortness of breath. 09/27/15   Lavera Guise, MD  azithromycin (ZITHROMAX) 250 MG tablet Take 1 tablet (250 mg total) by mouth daily. 03/10/15   Leroy Sea, MD  montelukast (SINGULAIR) 10 MG tablet Take 1 tablet (10 mg total) by mouth at bedtime. 03/10/15   Leroy Sea, MD  nicotine (NICODERM CQ - DOSED IN MG/24 HOURS) 21 mg/24hr patch Place 1 patch (21 mg total) onto the skin daily. 03/10/15   Leroy Sea, MD  predniSONE (DELTASONE) 5 MG tablet Label  & dispense according to the schedule below. 10 Pills PO for 3 days then,  8 Pills PO for 3 days, 6 Pills PO for 3 days, 4 Pills PO for 3 days, 2 Pills PO for 3 days, 1 Pills PO for 3 days, 1/2 Pill  PO for 3 days then STOP. Total 95 pills. 03/10/15   Leroy Sea, MD  tiotropium (SPIRIVA) 18 MCG inhalation capsule Place 1 capsule (18 mcg total) into inhaler and inhale daily. 09/27/15   Marijean Niemann, MD   BP 140/99 mmHg  Pulse 87  Temp(Src) 98.6 F (37 C) (Oral)  Resp 23  SpO2 99%  LMP 09/23/2015 Physical Exam  Constitutional: She is oriented to person, place, and  time. She appears well-developed and well-nourished. No distress.  HENT:  Head: Normocephalic and atraumatic.  Mouth/Throat: No oropharyngeal exudate.  Eyes: Pupils are equal, round, and reactive to light. No scleral icterus.  Neck: Normal range of motion. Neck supple.  Cardiovascular: Normal rate, regular rhythm, normal heart sounds and intact distal pulses.  Exam reveals no gallop and no friction rub.   No murmur heard. Pulmonary/Chest: Effort normal. No respiratory distress. She has wheezes. She has no rales.  Abdominal: Soft. She exhibits no distension. There is no tenderness. There is no rebound.  Musculoskeletal: Normal range of motion. She exhibits no edema or tenderness.  Neurological: She is alert and oriented to person, place, and time. No cranial nerve deficit. She exhibits normal muscle tone. Coordination normal.  Skin: Skin is warm and dry. No rash noted. She is not diaphoretic. No erythema. No pallor.  Psychiatric: She has a normal mood and affect.  Nursing note and vitals reviewed.   ED Course  Procedures (including critical care time) Labs Review Labs Reviewed - No data to display  Imaging Review Dg Chest 2 View  09/27/2015  CLINICAL DATA:  Wheezing and shortness of Breath EXAM: CHEST - 2 VIEW COMPARISON:  03/09/2015 FINDINGS: Cardiac shadow is within normal limits. The lungs are hyperinflated with mild interstitial changes stable from the prior exam. No acute bony abnormality is seen. IMPRESSION: COPD with chronic interstitial changes.  No acute abnormality noted. Electronically Signed   By: Alcide Clever M.D.   On: 09/27/2015 15:28   I have personally reviewed and evaluated these images and lab results as part of my medical decision-making.   EKG Interpretation   Date/Time:  Saturday September 27 2015 18:13:45 EST Ventricular Rate:  93 PR Interval:  122 QRS Duration: 72 QT Interval:  354 QTC Calculation: 440 R Axis:   76 Text Interpretation:  Sinus rhythm Abnormal  T, consider ischemia, anterior  leads No significant change since last tracing Confirmed by LIU MD, DANA  ([id number]) on 09/27/2015 6:30:18 PM      MDM   Final diagnoses:  Chronic obstructive pulmonary disease with acute exacerbation [facility])    Patient is a 50 year old female with a history of COPD who presents for 2 days worsening shortness of breath and wheezing. She ran out of her inhalers last night and has not used any today. Further history and exam as above notable for stable vital signs with expiratory wheezing throughout. Chest x-ray without evidence of pneumonia and EKG without acute ischemic changes or arrhythmias and unchanged from prior. Breathing treatments done here with significant improvement in symptoms. Steroids given.  I have reviewed all imaging and workup. Patient stable for discharge home.  I have reviewed all results with the patient. Advised to f/u with PCP in 3 days for further management and evaluation. Will refill albuterol rx. Patient agrees to stated plan.  All questions answered. Advised to call or return to have any questions, new symptoms, change in symptoms, or symptoms that they do not understand.    Marijean Niemann, MD 09/28/15 1610  Lavera Guise, MD 09/28/15 [phone]

## 2015-09-27 NOTE — ED Notes (Signed)
Pt updated with test results and delay explained.

## 2015-09-27 NOTE — ED Notes (Signed)
Pt in bathroom

## 2016-07-24 IMAGING — CT CT ANGIO CHEST
1 of 9 series · 14 of 36 positions shown · IV contrast (omnipaque)
Comparison: Chest radiograph performed 03/09/2015

CLINICAL DATA: Acute onset of generalized chest pain and shortness
of breath. Initial encounter.

EXAM:
CT ANGIOGRAPHY CHEST WITH CONTRAST
TECHNIQUE: Multidetector CT imaging of the chest was performed using the
standard protocol during bolus administration of intravenous
contrast. Multiplanar CT image reconstructions and MIPs were
obtained to evaluate the vascular anatomy.
CONTRAST:  100mL OMNIPAQUE IOHEXOL 350 MG/ML SOLN

[Series 406: thins pacs · axial · 0.57mm/px · z∈[+72,+319]mm · 14 of 285 slices shown]
[im 19/285  lung]
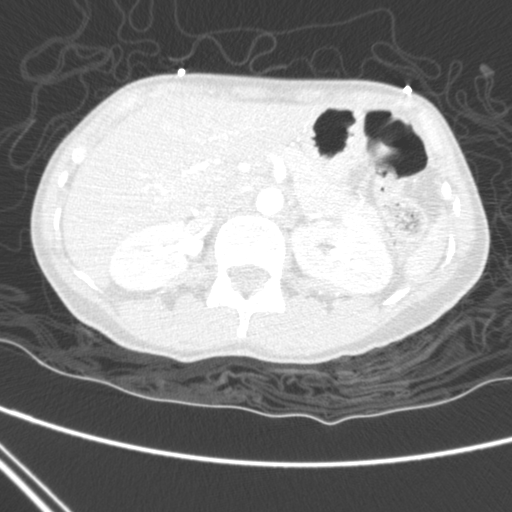
[im 38/285  mediastinal]
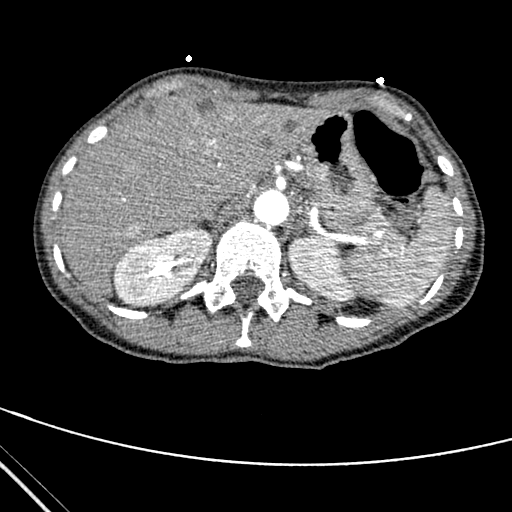
[im 57/285  lung]
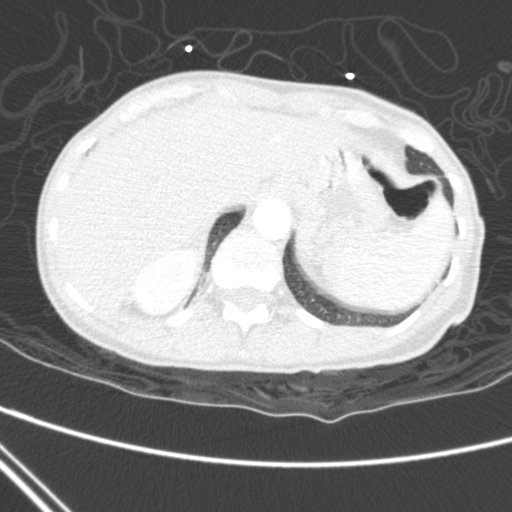
[im 76/285  mediastinal]
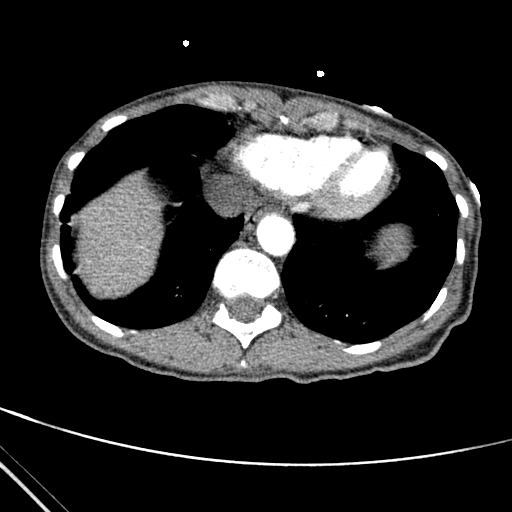
[im 95/285  lung]
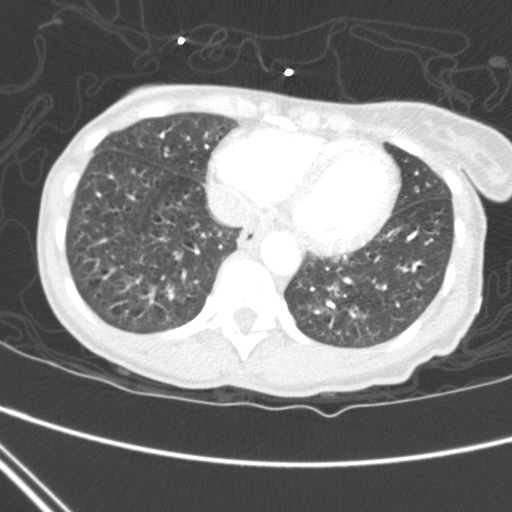
[im 114/285  mediastinal]
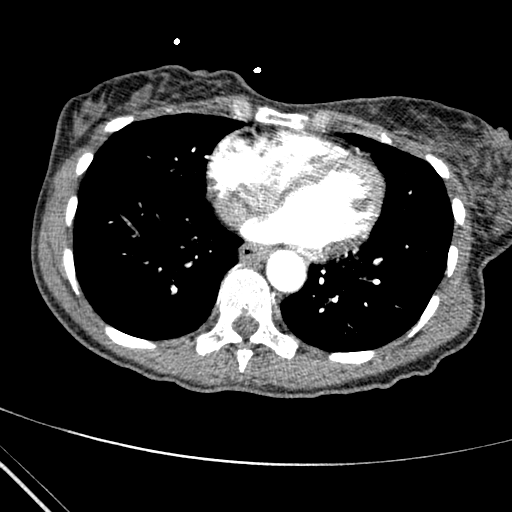
[im 133/285  lung]
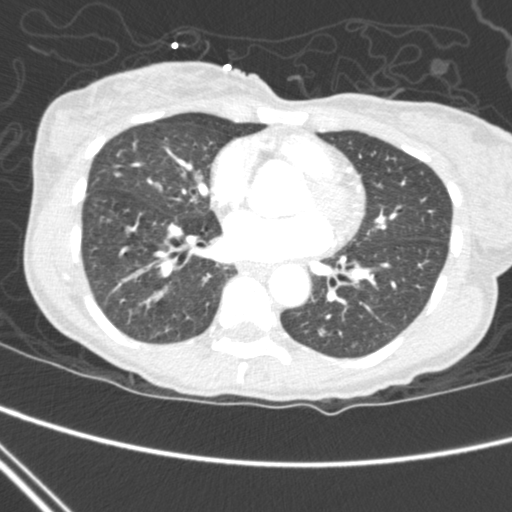
[im 152/285  mediastinal]
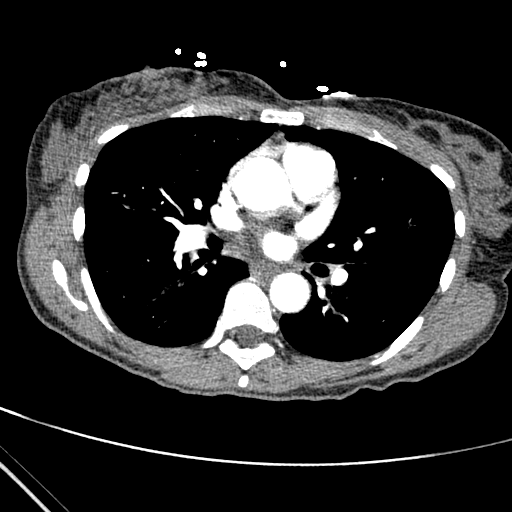
[im 171/285  lung]
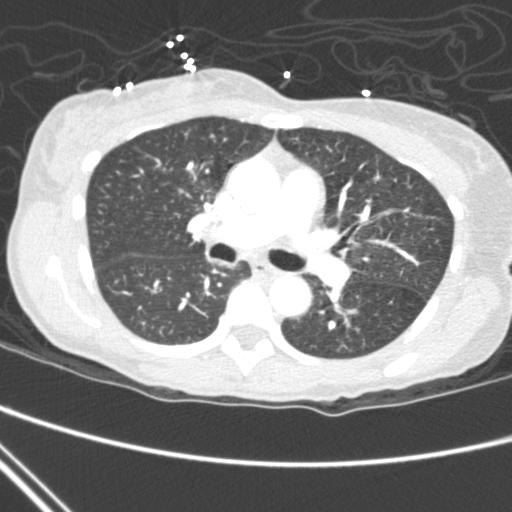
[im 190/285  mediastinal]
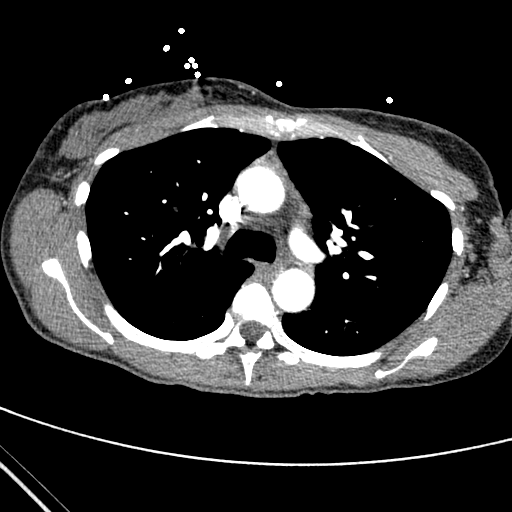
[im 209/285  lung]
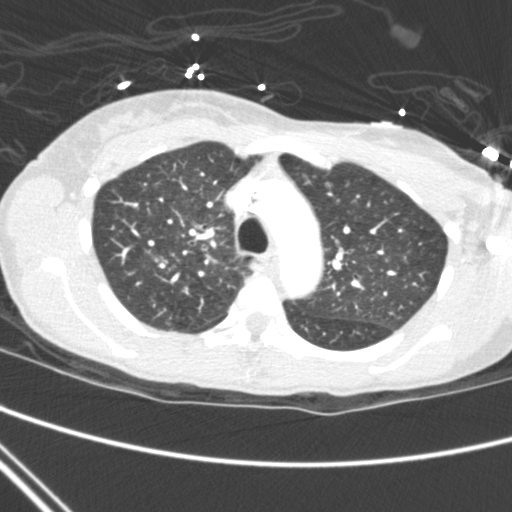
[im 228/285  mediastinal]
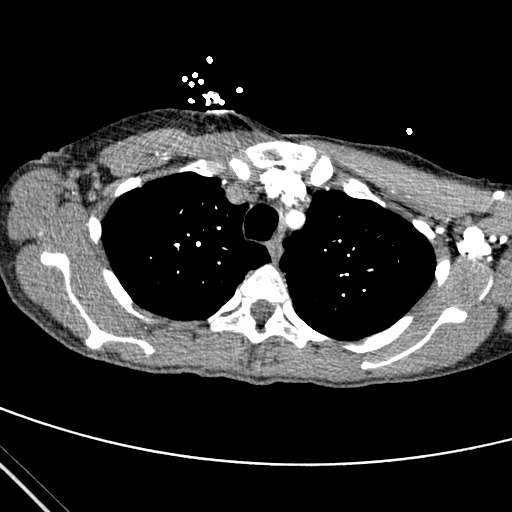
[im 247/285  lung]
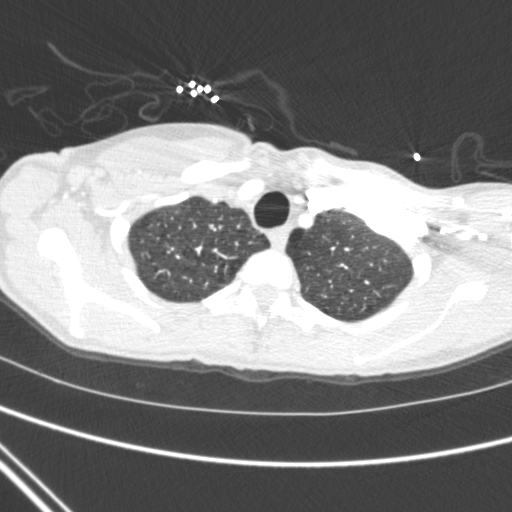
[im 266/285  mediastinal]
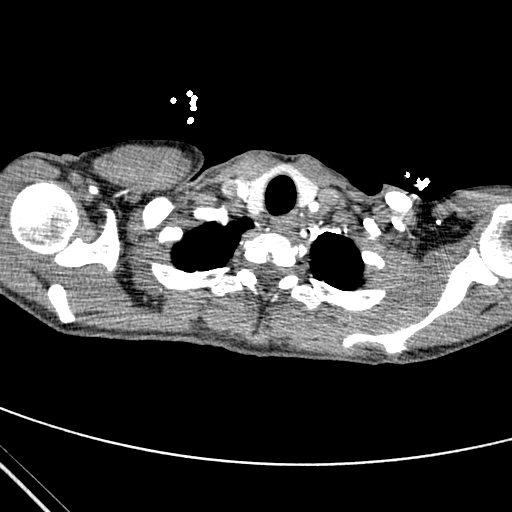

[14 of 36 positions shown; findings below may reference images not displayed]

FINDINGS: There is no evidence of pulmonary embolus.

Minimal right basilar atelectasis is noted. The lungs otherwise
appear grossly clear bilaterally. There is slight peribronchial
thickening, nonspecific in appearance. Scattered blebs are seen at
the lung apices. There is no evidence of significant focal
consolidation, pleural effusion or pneumothorax. No masses are
identified; no abnormal focal contrast enhancement is seen.

The mediastinum is unremarkable in appearance. No mediastinal
lymphadenopathy is seen. No pericardial effusion is identified. The
great vessels are grossly unremarkable in appearance. No axillary
lymphadenopathy is seen. The visualized portions of the thyroid
gland are unremarkable in appearance.

Scattered hypodensities within the liver are nonspecific but may
reflect small cysts, measuring up to 1.9 cm in size. The spleen is
unremarkable in appearance. The visualized portions of the pancreas,
adrenal glands and kidneys are grossly unremarkable.

No acute osseous abnormalities are seen.

Review of the MIP images confirms the above findings.
IMPRESSION: 1. No evidence of pulmonary embolus.
2. Minimal right basilar atelectasis noted. Slight peribronchial
thickening is nonspecific in appearance. Scattered blebs at the lung
apices. Lungs otherwise grossly clear.
3. Scattered nonspecific hypodensities within the liver may reflect
small cysts, measuring up to 1.9 cm in size.
